# Patient Record
Sex: Female | Born: 1960 | Race: White | Hispanic: No | Smoking: Never smoker
Health system: Southern US, Community
[De-identification: ages and names within clinical notes are randomized; demographics above are authoritative.]

## PROBLEM LIST (undated history)

## (undated) DIAGNOSIS — Z8709 Personal history of other diseases of the respiratory system: Secondary | ICD-10-CM

## (undated) DIAGNOSIS — IMO0002 Reserved for concepts with insufficient information to code with codable children: Secondary | ICD-10-CM

## (undated) DIAGNOSIS — N3090 Cystitis, unspecified without hematuria: Secondary | ICD-10-CM

## (undated) DIAGNOSIS — D219 Benign neoplasm of connective and other soft tissue, unspecified: Secondary | ICD-10-CM

## (undated) DIAGNOSIS — R87619 Unspecified abnormal cytological findings in specimens from cervix uteri: Secondary | ICD-10-CM

## (undated) DIAGNOSIS — B019 Varicella without complication: Secondary | ICD-10-CM

## (undated) DIAGNOSIS — B379 Candidiasis, unspecified: Secondary | ICD-10-CM

## (undated) DIAGNOSIS — E611 Iron deficiency: Secondary | ICD-10-CM

## (undated) HISTORY — DX: Iron deficiency: E61.1

## (undated) HISTORY — DX: Benign neoplasm of connective and other soft tissue, unspecified: D21.9

## (undated) HISTORY — DX: Candidiasis, unspecified: B37.9

## (undated) HISTORY — DX: Unspecified abnormal cytological findings in specimens from cervix uteri: R87.619

## (undated) HISTORY — PX: MYOMECTOMY: SHX85

## (undated) HISTORY — DX: Varicella without complication: B01.9

## (undated) HISTORY — DX: Cystitis, unspecified without hematuria: N30.90

## (undated) HISTORY — PX: WISDOM TOOTH EXTRACTION: SHX21

## (undated) HISTORY — PX: TONSILLECTOMY: SHX5217

## (undated) HISTORY — DX: Personal history of other diseases of the respiratory system: Z87.09

## (undated) HISTORY — DX: Reserved for concepts with insufficient information to code with codable children: IMO0002

---

## 2000-09-20 ENCOUNTER — Encounter (INDEPENDENT_AMBULATORY_CARE_PROVIDER_SITE_OTHER): Payer: Self-pay | Admitting: *Deleted

## 2000-09-20 LAB — CONVERTED CEMR LAB

## 2000-09-25 ENCOUNTER — Encounter: Admission: RE | Admit: 2000-09-25 | Discharge: 2000-09-25 | Payer: Self-pay | Admitting: Family Medicine

## 2001-06-27 ENCOUNTER — Encounter: Admission: RE | Admit: 2001-06-27 | Discharge: 2001-06-27 | Payer: Self-pay | Admitting: Family Medicine

## 2002-05-13 ENCOUNTER — Other Ambulatory Visit: Admission: RE | Admit: 2002-05-13 | Discharge: 2002-05-13 | Payer: Self-pay | Admitting: Obstetrics and Gynecology

## 2003-02-05 ENCOUNTER — Encounter: Admission: RE | Admit: 2003-02-05 | Discharge: 2003-05-06 | Payer: Self-pay | Admitting: Family Medicine

## 2003-06-02 ENCOUNTER — Other Ambulatory Visit: Admission: RE | Admit: 2003-06-02 | Discharge: 2003-06-02 | Payer: Self-pay | Admitting: *Deleted

## 2003-06-04 ENCOUNTER — Encounter: Payer: Self-pay | Admitting: Obstetrics and Gynecology

## 2003-06-04 ENCOUNTER — Ambulatory Visit (HOSPITAL_COMMUNITY): Admission: RE | Admit: 2003-06-04 | Discharge: 2003-06-04 | Payer: Self-pay | Admitting: Obstetrics and Gynecology

## 2004-07-29 ENCOUNTER — Ambulatory Visit (HOSPITAL_COMMUNITY): Admission: RE | Admit: 2004-07-29 | Discharge: 2004-07-29 | Payer: Self-pay | Admitting: Family Medicine

## 2004-07-29 ENCOUNTER — Other Ambulatory Visit: Admission: RE | Admit: 2004-07-29 | Discharge: 2004-07-29 | Payer: Self-pay | Admitting: Obstetrics and Gynecology

## 2004-08-02 ENCOUNTER — Other Ambulatory Visit: Admission: RE | Admit: 2004-08-02 | Discharge: 2004-08-02 | Payer: Self-pay | Admitting: Obstetrics and Gynecology

## 2005-07-31 ENCOUNTER — Ambulatory Visit (HOSPITAL_COMMUNITY): Admission: RE | Admit: 2005-07-31 | Discharge: 2005-07-31 | Payer: Self-pay | Admitting: Obstetrics and Gynecology

## 2005-08-03 ENCOUNTER — Other Ambulatory Visit: Admission: RE | Admit: 2005-08-03 | Discharge: 2005-08-03 | Payer: Self-pay | Admitting: Obstetrics and Gynecology

## 2005-08-17 ENCOUNTER — Encounter: Admission: RE | Admit: 2005-08-17 | Discharge: 2005-08-17 | Payer: Self-pay | Admitting: Obstetrics and Gynecology

## 2006-08-08 ENCOUNTER — Encounter: Admission: RE | Admit: 2006-08-08 | Discharge: 2006-08-08 | Payer: Self-pay | Admitting: Obstetrics and Gynecology

## 2006-10-09 ENCOUNTER — Other Ambulatory Visit: Admission: RE | Admit: 2006-10-09 | Discharge: 2006-10-09 | Payer: Self-pay | Admitting: Obstetrics and Gynecology

## 2006-12-28 ENCOUNTER — Emergency Department (HOSPITAL_COMMUNITY): Admission: EM | Admit: 2006-12-28 | Discharge: 2006-12-28 | Payer: Self-pay | Admitting: Emergency Medicine

## 2007-01-18 ENCOUNTER — Encounter (INDEPENDENT_AMBULATORY_CARE_PROVIDER_SITE_OTHER): Payer: Self-pay | Admitting: *Deleted

## 2007-08-27 ENCOUNTER — Encounter: Admission: RE | Admit: 2007-08-27 | Discharge: 2007-08-27 | Payer: Self-pay | Admitting: Family Medicine

## 2007-09-19 ENCOUNTER — Encounter: Admission: RE | Admit: 2007-09-19 | Discharge: 2007-09-19 | Payer: Self-pay | Admitting: Family Medicine

## 2008-08-27 ENCOUNTER — Encounter: Admission: RE | Admit: 2008-08-27 | Discharge: 2008-08-27 | Payer: Self-pay | Admitting: Obstetrics and Gynecology

## 2008-09-08 ENCOUNTER — Encounter: Admission: RE | Admit: 2008-09-08 | Discharge: 2008-09-08 | Payer: Self-pay | Admitting: Obstetrics and Gynecology

## 2009-11-29 ENCOUNTER — Encounter: Admission: RE | Admit: 2009-11-29 | Discharge: 2009-11-29 | Payer: Self-pay | Admitting: Obstetrics and Gynecology

## 2012-01-30 DIAGNOSIS — Z30430 Encounter for insertion of intrauterine contraceptive device: Secondary | ICD-10-CM

## 2012-01-30 DIAGNOSIS — Z30433 Encounter for removal and reinsertion of intrauterine contraceptive device: Secondary | ICD-10-CM

## 2012-02-28 ENCOUNTER — Ambulatory Visit: Payer: Self-pay | Admitting: Obstetrics and Gynecology

## 2012-11-12 ENCOUNTER — Ambulatory Visit (INDEPENDENT_AMBULATORY_CARE_PROVIDER_SITE_OTHER): Payer: BC Managed Care – PPO | Admitting: Obstetrics and Gynecology

## 2012-11-12 ENCOUNTER — Encounter: Payer: Self-pay | Admitting: Obstetrics and Gynecology

## 2012-11-12 VITALS — BP 94/60 | HR 76 | Resp 16 | Ht 66.0 in | Wt 167.0 lb

## 2012-11-12 DIAGNOSIS — Z124 Encounter for screening for malignant neoplasm of cervix: Secondary | ICD-10-CM

## 2012-11-12 DIAGNOSIS — R6889 Other general symptoms and signs: Secondary | ICD-10-CM

## 2012-11-12 DIAGNOSIS — IMO0002 Reserved for concepts with insufficient information to code with codable children: Secondary | ICD-10-CM | POA: Insufficient documentation

## 2012-11-12 NOTE — Progress Notes (Signed)
Annual Exam:    Subjective:    Alexa Berger is a 51 y.o. female, G2P1, who presents for an annual exam.    Last Pap: 01/26/2011 WNL: Yes Regular Periods:no Contraception: Mirena  Monthly Breast exam:no Tetanus<4yrs:yes Nl.Bladder Function:yes Daily BMs:yes Healthy Diet:yes Calcium:yes "Chews" Mammogram:no Date of Mammogram: 2012 per pt but records show 11/29/2009 Exercise:yes Have often Exercise: 2-3 times a week. Seatbelt: yes Abuse at home: no Stressful work:yes Sigmoid-colonoscopy: N/A Bone Density: No PCP: Dr. Larwance Sachs Change in PMH: No Changes Change in FMH:No Changes   History   Social History  . Marital Status: Married    Spouse Name: N/A    Number of Children: N/A  . Years of Education: N/A   Social History Main Topics  . Smoking status: Never Smoker   . Smokeless tobacco: Never Used  . Alcohol Use: Yes     Comment: socially   . Drug Use: No  . Sexually Active: Not Currently -- Female partner(s)    Birth Control/ Protection: IUD   Other Topics Concern  . None   Social History Narrative  . None    Menstrual cycle:   LMP: No LMP recorded. Patient is not currently having periods (Reason: IUD).           Cycle: No menses with IUD  The following portions of the patient's history were reviewed and updated as appropriate: allergies, current medications, past family history, past medical history, past social history, past surgical history and problem list.  Review of Systems Pertinent items are noted in HPI. Breast:Negative for breast lump,nipple discharge or nipple retraction Gastrointestinal: Negative for abdominal pain, change in bowel habits or rectal bleeding Urinary:negative   Objective:    BP 94/60  Ht 5\' 6"  (1.676 m)  Wt 167 lb (75.751 kg)  BMI 26.95 kg/m2    Weight:  Wt Readings from Last 1 Encounters:  11/12/12 167 lb (75.751 kg)          BMI: Body mass index is 26.95 kg/(m^2).  General Appearance: Alert, appropriate appearance  for age. No acute distress HEENT: Grossly normal Neck / Thyroid: Supple, no masses, nodes or enlargement Lungs: clear to auscultation bilaterally Back: No CVA tenderness Breast Exam: No masses or nodes.No dimpling, nipple retraction or discharge. Cardiovascular: Regular rate and rhythm. S1, S2, no murmur Gastrointestinal: Soft, non-tender, no masses or organomegaly Pelvic Exam: External genitalia: normal general appearance Vaginal: normal mucosa without prolapse or lesions Cervix: IUD string visualized Adnexa: normal bimanual exam Uterus: normal single, nontender Rectovaginal: normal rectal, no masses Lymphatic Exam: Non-palpable nodes in neck, clavicular, axillary, or inguinal regions Skin: no rash or abnormalities Neurologic: Normal gait and speech, no tremor  Psychiatric: Alert and oriented, appropriate affect.   Wet Prep:not applicable Urinalysis:not applicable UPT: Not done   Assessment:    Normal gyn exam    Plan:    mammogram pap smear return annually or prn STD screening: declined Contraception:IUD   Dierdre Forth MD

## 2012-11-15 LAB — PAP IG AND HPV HIGH-RISK

## 2012-11-21 ENCOUNTER — Encounter: Payer: Self-pay | Admitting: Obstetrics and Gynecology

## 2012-12-24 ENCOUNTER — Other Ambulatory Visit: Payer: Self-pay | Admitting: Obstetrics and Gynecology

## 2012-12-24 DIAGNOSIS — Z1231 Encounter for screening mammogram for malignant neoplasm of breast: Secondary | ICD-10-CM

## 2013-01-28 ENCOUNTER — Ambulatory Visit
Admission: RE | Admit: 2013-01-28 | Discharge: 2013-01-28 | Disposition: A | Discharge status: Home / Self Care | Payer: BC Managed Care – PPO | Source: Ambulatory Visit | Attending: Obstetrics and Gynecology | Admitting: Obstetrics and Gynecology

## 2013-09-18 ENCOUNTER — Other Ambulatory Visit (HOSPITAL_COMMUNITY): Payer: Self-pay | Admitting: Endocrinology

## 2013-09-18 DIAGNOSIS — E0590 Thyrotoxicosis, unspecified without thyrotoxic crisis or storm: Secondary | ICD-10-CM

## 2013-10-02 ENCOUNTER — Ambulatory Visit (HOSPITAL_COMMUNITY)
Admission: RE | Admit: 2013-10-02 | Discharge: 2013-10-02 | Disposition: A | Discharge status: Home / Self Care | Payer: BC Managed Care – PPO | Source: Ambulatory Visit | Attending: Endocrinology | Admitting: Endocrinology

## 2013-10-02 DIAGNOSIS — E0590 Thyrotoxicosis, unspecified without thyrotoxic crisis or storm: Secondary | ICD-10-CM | POA: Insufficient documentation

## 2013-10-03 ENCOUNTER — Other Ambulatory Visit (HOSPITAL_COMMUNITY): Payer: Self-pay | Admitting: Endocrinology

## 2013-10-03 ENCOUNTER — Encounter (HOSPITAL_COMMUNITY): Payer: Self-pay

## 2013-10-03 ENCOUNTER — Encounter (HOSPITAL_COMMUNITY)
Admission: RE | Admit: 2013-10-03 | Discharge: 2013-10-03 | Disposition: A | Discharge status: Home / Self Care | Payer: BC Managed Care – PPO | Source: Ambulatory Visit | Attending: Endocrinology | Admitting: Endocrinology

## 2013-10-03 DIAGNOSIS — E0590 Thyrotoxicosis, unspecified without thyrotoxic crisis or storm: Secondary | ICD-10-CM

## 2013-10-03 MED ORDER — SODIUM IODIDE I 131 CAPSULE
12.0000 | Freq: Once | INTRAVENOUS | Status: AC | PRN
  Administered 2013-10-02: 12 via ORAL

## 2013-10-03 MED ORDER — SODIUM PERTECHNETATE TC 99M INJECTION
10.0000 | Freq: Once | INTRAVENOUS | Status: AC | PRN
  Administered 2013-10-03: 10 via INTRAVENOUS

## 2014-04-28 ENCOUNTER — Ambulatory Visit (INDEPENDENT_AMBULATORY_CARE_PROVIDER_SITE_OTHER): Payer: BC Managed Care – PPO | Admitting: Sports Medicine

## 2014-04-28 ENCOUNTER — Encounter: Payer: Self-pay | Admitting: Sports Medicine

## 2014-04-28 VITALS — BP 93/65 | Ht 66.0 in | Wt 160.0 lb

## 2014-04-28 DIAGNOSIS — M722 Plantar fascial fibromatosis: Secondary | ICD-10-CM | POA: Insufficient documentation

## 2014-04-28 NOTE — Progress Notes (Signed)
Patient ID: Alexa Berger, female   DOB: July 06, 1961, 53 y.o.   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 0.46 on RT There is some globular thickening at insertion

## 2014-04-28 NOTE — Patient Instructions (Signed)
Use standard set of exercises Goal is 3 sets of 15 Twice daily if time  When standing or walking use sports insole and arch strap  Stretch twice daily do 3 stretches of 30 secs with thumb massage  Icing at the end of the day really helps but you have to make it painful - eg foot in ice water - 5 to 10 mins  Do these things for 2 months and let's recheck  Thickness today was 0.46 vs. 0.66

## 2014-04-28 NOTE — Assessment & Plan Note (Signed)
Given sports insoles with scaphoid pad on left Arch strap HEP Stretching program Icing Keep up activity but cut down by 30%  Reck 2 mos

## 2014-07-02 ENCOUNTER — Ambulatory Visit: Payer: BC Managed Care – PPO | Admitting: Sports Medicine

## 2014-08-18 ENCOUNTER — Encounter: Payer: Self-pay | Admitting: Sports Medicine

## 2014-08-18 ENCOUNTER — Ambulatory Visit (INDEPENDENT_AMBULATORY_CARE_PROVIDER_SITE_OTHER): Payer: BC Managed Care – PPO | Admitting: Sports Medicine

## 2014-08-18 VITALS — BP 106/73 | HR 83 | Ht 66.0 in | Wt 160.0 lb

## 2014-08-18 DIAGNOSIS — M79609 Pain in unspecified limb: Secondary | ICD-10-CM | POA: Diagnosis not present

## 2014-08-18 DIAGNOSIS — M79671 Pain in right foot: Secondary | ICD-10-CM

## 2014-08-18 DIAGNOSIS — M722 Plantar fascial fibromatosis: Secondary | ICD-10-CM

## 2014-08-18 DIAGNOSIS — M79674 Pain in right toe(s): Secondary | ICD-10-CM | POA: Insufficient documentation

## 2014-08-18 NOTE — Progress Notes (Signed)
   Subjective:    Patient ID: Alexa Berger, female    DOB: [birth year], 53 y.o.   MRN: [id number]  HPI Ms. Saxton is a 53 year old female presents for followup of bilateral plantar fasciitis foot pain. She states her pain is 50% improved. She has been doing home exercises and yoga. She still has some left heel pain on the patient plantar surface with the first few steps in the morning. She is now able to walk for approximately one-hour on a treadmill without pain. The exercises and stretching have relieved her symptoms. Prolonged activity aggravates him. Shows some notes some right great toe pain that is worse with walking. She denies any associated swelling, numbness, tingling, or weakness.  Past medical history, social history, medications, and allergies were reviewed and are up to date in the chart.  Review of Systems 7 point review of systems was performed and was otherwise negative unless noted in the history of present illness.    Objective:   Physical Exam BP 106/73  Pulse 83  Ht 5\' 6"  (1.676 m)  Wt 160 lb (72.576 kg)  BMI 25.84 kg/m2 GEN: The patient is well-developed well-nourished female and in no acute distress.  She is awake alert and oriented x3. SKIN: warm and well-perfused, no rash  EXTR: No lower extremity edema or calf tenderness Neuro: Strength 5/5 globally. Sensation intact throughout. DTRs 2/4 bilaterally. No focal deficits. Vasc: +2 bilateral distal pulses. No edema.  MSK: Examination of the bilateral feet reveals a somewhat flattened medial longitudinal arch. She has a Morton's foot bilaterally. There is very mild tenderness to palpation with passive stretching of the plantar fascia at the left heel. She has full range of motion at the ankle and forefoot. No overlying erythema, swelling, or induration.  Limited musculoskeletal ultrasound: Long and short axis views of the bilateral plantar fascia were obtained. The left plantar fascia measures 0.64 cm with less  echogenicity seen at the plantar fascia 1 cm distal to the calcaneus. Examination of the right first MTP joint reveals increased echogenicity and small calcific spur at the dorsal aspect of the medial first MTP joint on the right. This is correlated with the location of her pain.     Assessment & Plan:  Please see problem based assessment and plan.

## 2014-08-18 NOTE — Assessment & Plan Note (Signed)
-  We added bilateral 1st ray posts, as this is likely related to additional pressure from her Morton's foot. -A scaphoid pad was added to the right insole as well -An additional pair of sport insoles was provided today with small scaphoid pads bilaterally and 1st ray padding bilaterally. -Continue home stretches and exercises. Follow-up as needed.

## 2014-09-21 ENCOUNTER — Encounter: Payer: Self-pay | Admitting: Sports Medicine

## 2017-10-15 ENCOUNTER — Ambulatory Visit
Admission: RE | Admit: 2017-10-15 | Discharge: 2017-10-15 | Disposition: A | Discharge status: Home / Self Care | Payer: BC Managed Care – PPO | Source: Ambulatory Visit | Attending: Family Medicine | Admitting: Family Medicine

## 2017-10-15 ENCOUNTER — Other Ambulatory Visit: Payer: Self-pay | Admitting: Family Medicine

## 2017-10-15 DIAGNOSIS — G43909 Migraine, unspecified, not intractable, without status migrainosus: Secondary | ICD-10-CM

## 2018-03-19 ENCOUNTER — Other Ambulatory Visit: Payer: Self-pay | Admitting: Family Medicine

## 2018-03-19 ENCOUNTER — Ambulatory Visit
Admission: RE | Admit: 2018-03-19 | Discharge: 2018-03-19 | Disposition: A | Discharge status: Home / Self Care | Payer: BC Managed Care – PPO | Source: Ambulatory Visit | Attending: Family Medicine | Admitting: Family Medicine

## 2018-03-19 DIAGNOSIS — M545 Low back pain, unspecified: Secondary | ICD-10-CM

## 2019-06-09 ENCOUNTER — Other Ambulatory Visit: Payer: Self-pay

## 2019-06-09 DIAGNOSIS — Z20828 Contact with and (suspected) exposure to other viral communicable diseases: Secondary | ICD-10-CM

## 2019-06-09 DIAGNOSIS — Z20822 Contact with and (suspected) exposure to covid-19: Secondary | ICD-10-CM

## 2019-06-12 LAB — NOVEL CORONAVIRUS, NAA: SARS-CoV-2, NAA: NOT DETECTED

## 2019-06-30 ENCOUNTER — Other Ambulatory Visit: Payer: Self-pay

## 2019-06-30 DIAGNOSIS — Z20822 Contact with and (suspected) exposure to covid-19: Secondary | ICD-10-CM

## 2019-07-01 LAB — NOVEL CORONAVIRUS, NAA: SARS-CoV-2, NAA: NOT DETECTED

## 2019-07-09 ENCOUNTER — Other Ambulatory Visit: Payer: Self-pay

## 2019-07-09 DIAGNOSIS — Z20822 Contact with and (suspected) exposure to covid-19: Secondary | ICD-10-CM

## 2019-07-10 LAB — NOVEL CORONAVIRUS, NAA: SARS-CoV-2, NAA: NOT DETECTED

## 2019-08-07 ENCOUNTER — Other Ambulatory Visit: Payer: Self-pay

## 2019-08-07 DIAGNOSIS — Z20822 Contact with and (suspected) exposure to covid-19: Secondary | ICD-10-CM

## 2019-08-09 LAB — NOVEL CORONAVIRUS, NAA: SARS-CoV-2, NAA: NOT DETECTED

## 2019-08-26 ENCOUNTER — Other Ambulatory Visit: Payer: Self-pay

## 2019-08-26 DIAGNOSIS — Z20822 Contact with and (suspected) exposure to covid-19: Secondary | ICD-10-CM

## 2019-08-28 LAB — NOVEL CORONAVIRUS, NAA: SARS-CoV-2, NAA: NOT DETECTED

## 2019-09-17 ENCOUNTER — Other Ambulatory Visit: Payer: Self-pay

## 2019-09-17 DIAGNOSIS — Z20822 Contact with and (suspected) exposure to covid-19: Secondary | ICD-10-CM

## 2019-09-18 LAB — NOVEL CORONAVIRUS, NAA: SARS-CoV-2, NAA: NOT DETECTED

## 2019-10-14 ENCOUNTER — Other Ambulatory Visit: Payer: Self-pay

## 2019-10-14 DIAGNOSIS — Z20822 Contact with and (suspected) exposure to covid-19: Secondary | ICD-10-CM

## 2019-10-15 LAB — NOVEL CORONAVIRUS, NAA: SARS-CoV-2, NAA: NOT DETECTED

## 2019-11-13 ENCOUNTER — Ambulatory Visit: Payer: BC Managed Care – PPO | Attending: Internal Medicine

## 2019-11-13 DIAGNOSIS — Z20822 Contact with and (suspected) exposure to covid-19: Secondary | ICD-10-CM

## 2019-11-15 LAB — NOVEL CORONAVIRUS, NAA: SARS-CoV-2, NAA: NOT DETECTED

## 2021-02-04 ENCOUNTER — Other Ambulatory Visit: Payer: Self-pay | Admitting: Family Medicine

## 2021-02-04 DIAGNOSIS — E063 Autoimmune thyroiditis: Secondary | ICD-10-CM

## 2021-02-18 ENCOUNTER — Ambulatory Visit
Admission: RE | Admit: 2021-02-18 | Discharge: 2021-02-18 | Disposition: A | Discharge status: Home / Self Care | Payer: BC Managed Care – PPO | Source: Ambulatory Visit | Attending: Family Medicine | Admitting: Family Medicine

## 2021-02-18 DIAGNOSIS — E063 Autoimmune thyroiditis: Secondary | ICD-10-CM

## 2021-08-13 IMAGING — US US THYROID
1 series · 12 of 25 positions shown · non-contrast
Comparison: Prior thyroid scan and uptake on 10/02/2013

CLINICAL DATA: Palpable abnormality. History of hyperthyroidism,
Hashimoto's thyroiditis and palpable left thyroid nodule. Previous
nuclear medicine thyroid scan demonstrated nodular uptake in the mid
to lower left lobe consistent with a functioning nodule.

EXAM:
THYROID ULTRASOUND
TECHNIQUE: Ultrasound examination of the thyroid gland and adjacent soft
tissues was performed.

[Series 1: us thyroid · 0.03mm/px · 12 of 64 slices shown]
[im 3/64]
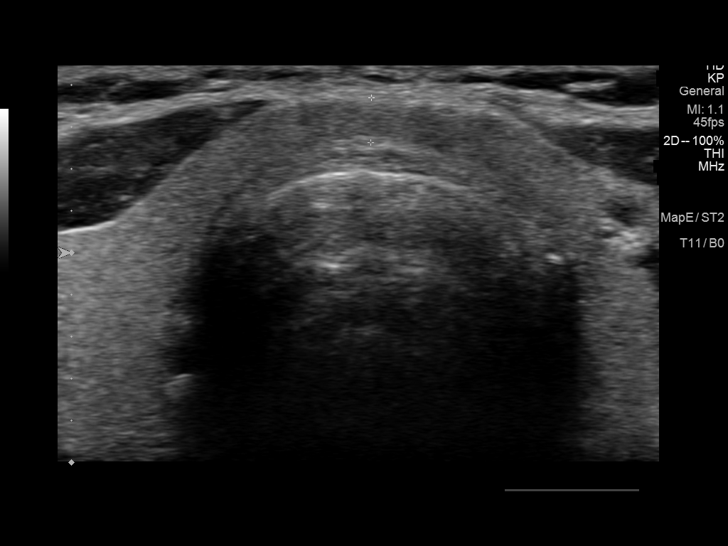
[im 8/64]
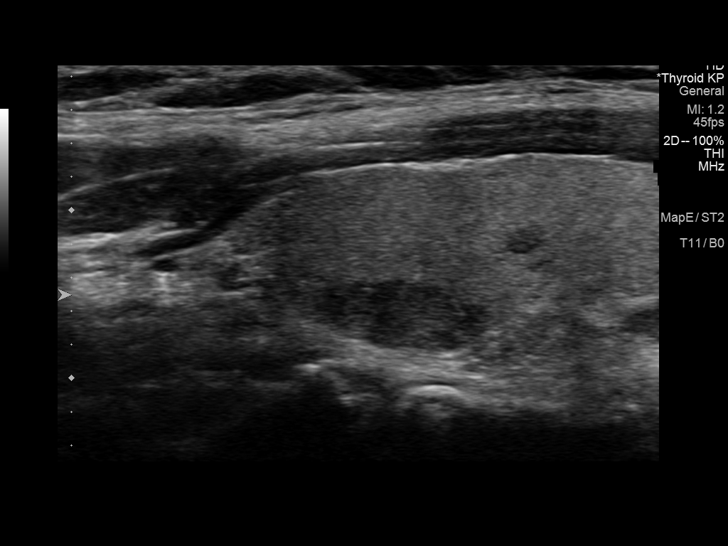
[im 14/64]
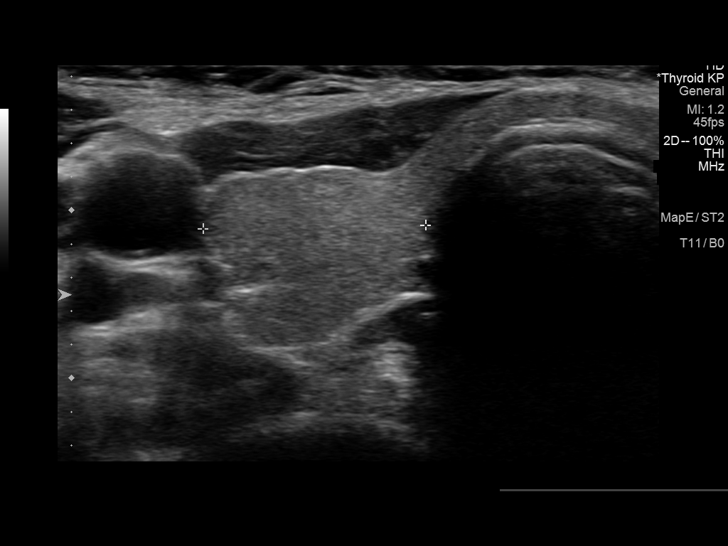
[im 19/64]
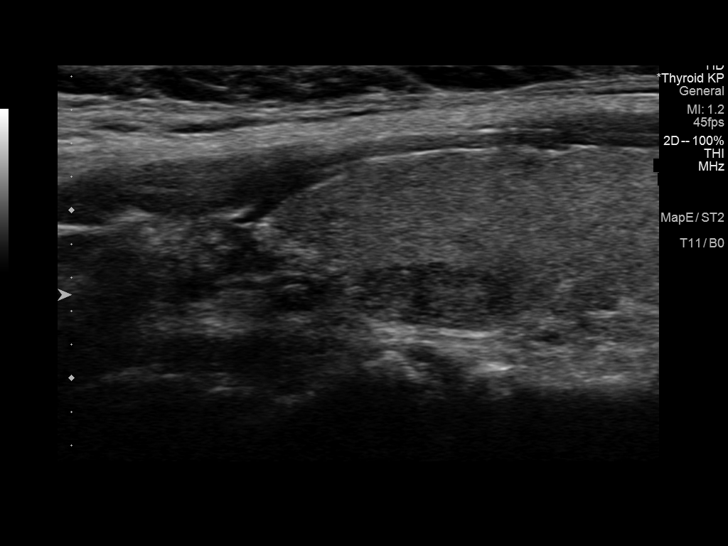
[im 24/64]
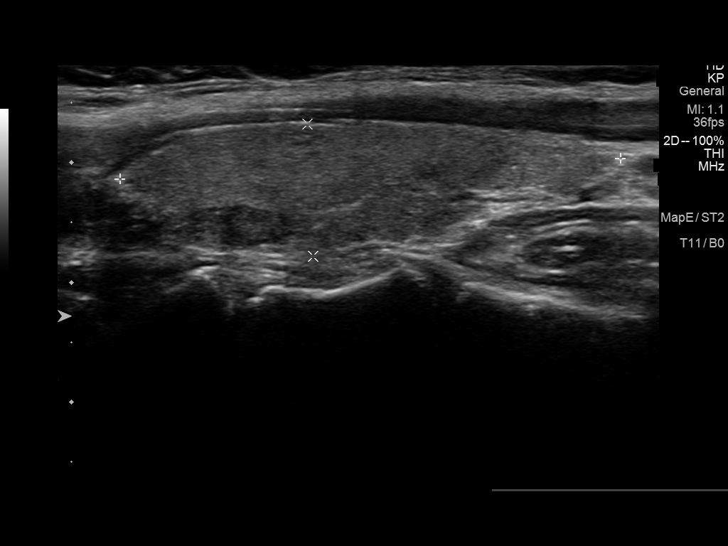
[im 29/64]
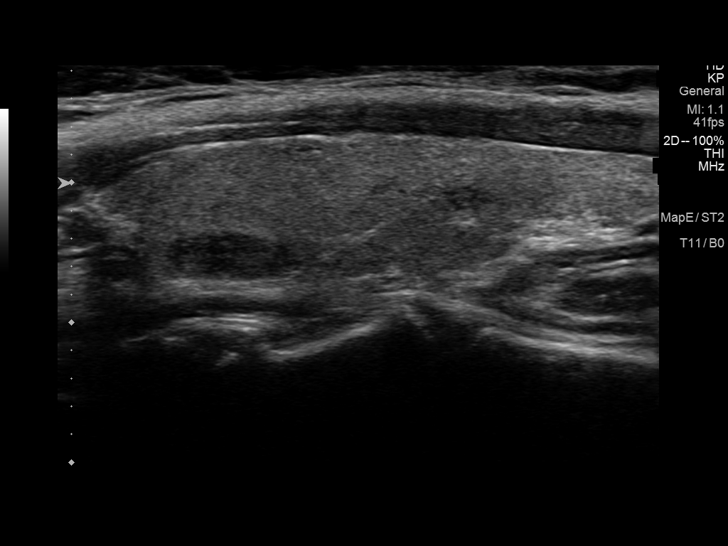
[im 35/64]
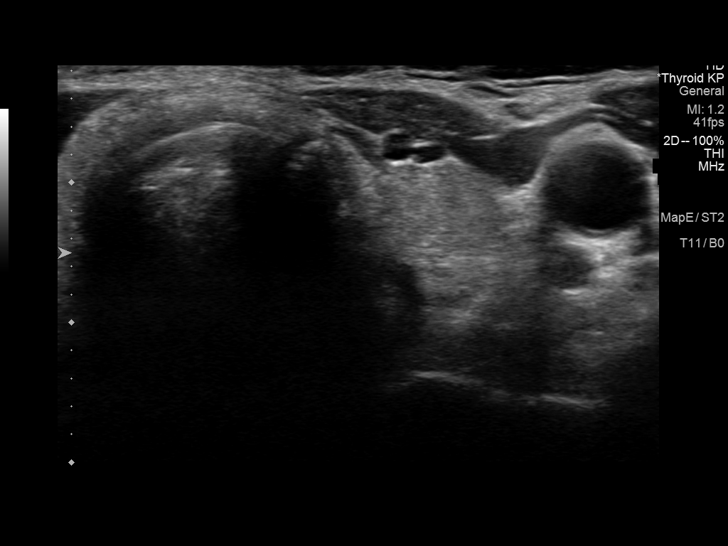
[im 40/64]
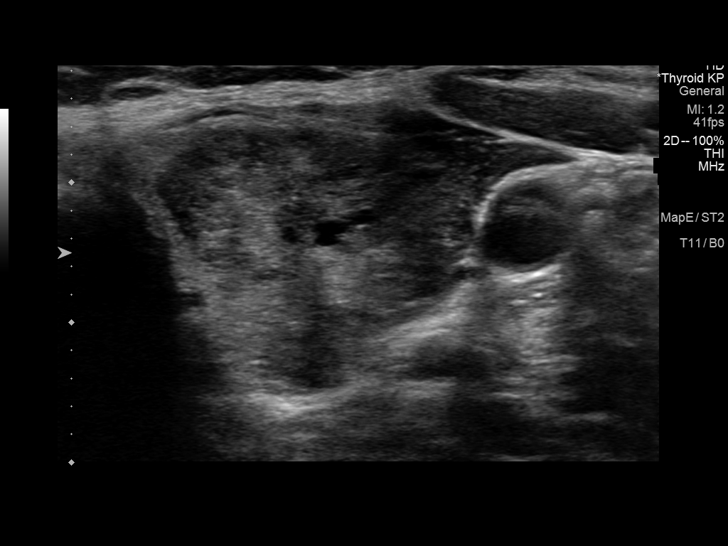
[im 45/64]
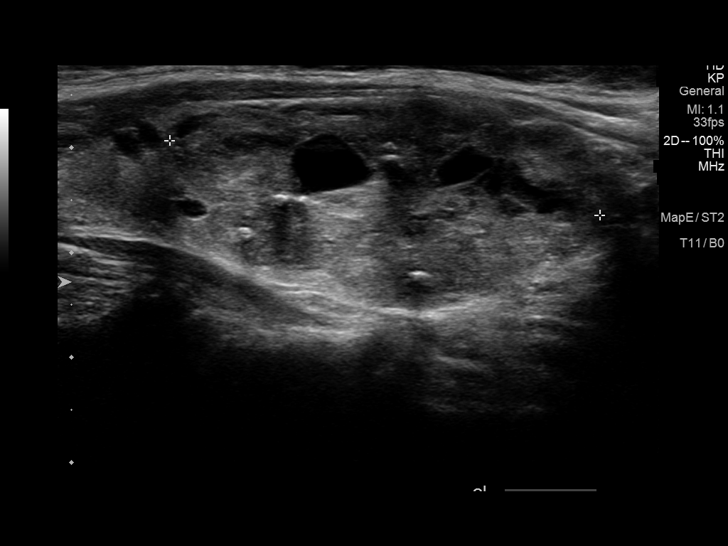
[im 50/64]
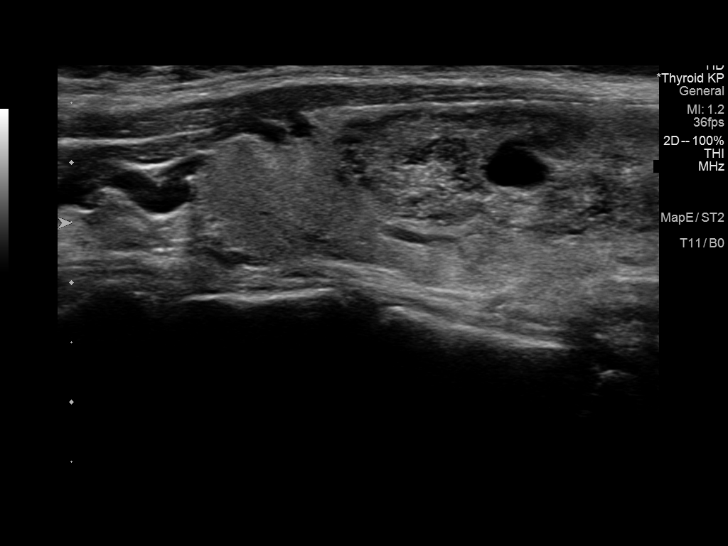
[im 56/64]
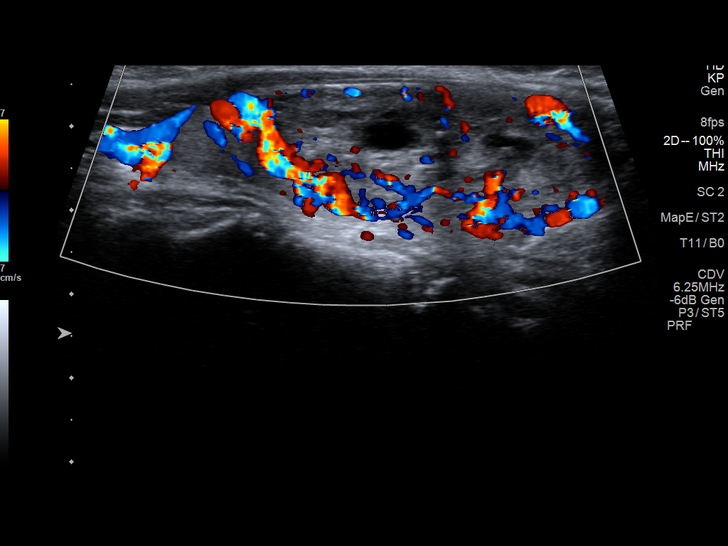
[im 61/64]
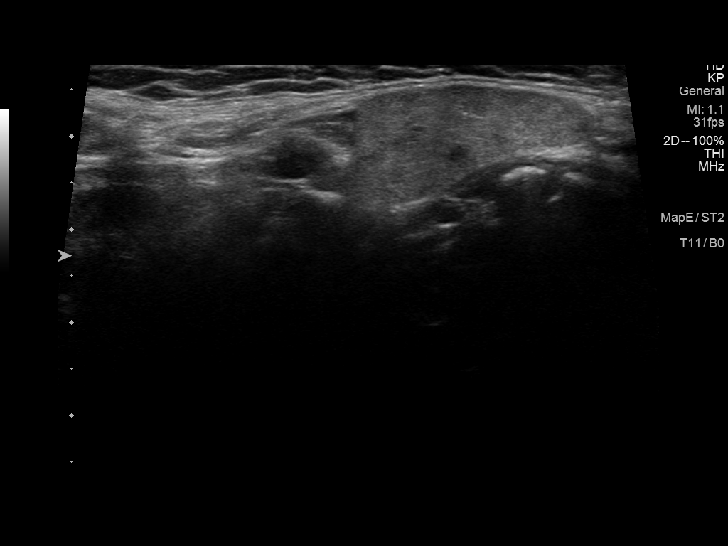

[12 of 25 positions shown; findings below may reference images not displayed]

FINDINGS: Parenchymal Echotexture: Normal

Isthmus: 0.2 cm

Right lobe: 4.2 x 1.1 x 1.3 cm

Left lobe: 5.6 x 2.1 x 1.3 cm

_________________________________________________________

Estimated total number of nodules >/= 1 cm: 2

Number of spongiform nodules >/=  2 cm not described below (TR1): 0

Number of mixed cystic and solid nodules >/= 1.5 cm not described
below (TR2): 0

_________________________________________________________

Nodule # 1:

Location: Right; Superior

Maximum size: 1.0 cm; Other 2 dimensions: 0.4 x 0.6 cm

Composition: solid/almost completely solid (2)

Echogenicity: hypoechoic (2)

Shape: not taller-than-wide (0)

Margins: smooth (0)

Echogenic foci: none (0)

ACR TI-RADS total points: 4.

ACR TI-RADS risk category: TR4 (4-6 points).

ACR TI-RADS recommendations:

*Given size (>/= 1 - 1.4 cm) and appearance, a follow-up ultrasound
in 1 year should be considered based on TI-RADS criteria.

_________________________________________________________

Nodule # 2:

Location: Left; Inferior

Maximum size: 4.2 cm; Other 2 dimensions: 2.2 x 1.8 cm

Composition: solid/almost completely solid (2). Some scattered
internal cystic areas.

Echogenicity: isoechoic (1)

Shape: not taller-than-wide (0)

Margins: ill-defined (0)

Echogenic foci: none (0)

ACR TI-RADS total points: 3.

ACR TI-RADS risk category: TR3 (3 points).

ACR TI-RADS recommendations:

**Given size (>/= 2.5 cm) and appearance, fine needle aspiration of
this mildly suspicious nodule should be considered based on TI-RADS
criteria. However, this corresponds well in size and location to the
previous nuclear medicine hyperfunctioning nodule and the fact that
this area was previously hyperfunctioning reduces the chance of
malignancy. Follow-up ultrasound may be reasonable given that there
is no prior ultrasound imaging of the thyroid for comparison.

_________________________________________________________

No abnormal lymph nodes identified.
IMPRESSION: 1. 4.2 cm left inferior thyroid nodule technically meets criteria
for fine-needle aspiration based on current ultrasound appearance.
However, this corresponds in size and location to the previous
hyperfunctioning nodule seen by nuclear medicine back in 0734. This
reduces the risk of malignancy. Follow-up ultrasound would also be
reasonable. Referral to Endocrinology for management might be
helpful.
2. 1 cm right superior thyroid nodule meets criteria for 1 year
follow-up ultrasound.

The above is in keeping with the ACR TI-RADS recommendations - [HOSPITAL] 7424;[DATE].

## 2021-09-15 LAB — COLOGUARD

## 2022-08-25 LAB — COLOGUARD: COLOGUARD: NEGATIVE

## 2022-08-25 LAB — EXTERNAL GENERIC LAB PROCEDURE: COLOGUARD: NEGATIVE

## 2022-08-30 ENCOUNTER — Encounter: Payer: Self-pay | Admitting: "Endocrinology

## 2022-08-30 ENCOUNTER — Ambulatory Visit: Payer: BC Managed Care – PPO | Admitting: "Endocrinology

## 2022-08-30 VITALS — BP 92/68 | HR 84 | Ht 64.75 in | Wt 168.2 lb

## 2022-08-30 DIAGNOSIS — E042 Nontoxic multinodular goiter: Secondary | ICD-10-CM

## 2022-08-30 NOTE — Progress Notes (Signed)
Endocrinology Consult Note                                            08/30/2022, 5:07 PM   Subjective:    Patient ID: Alexa Berger, female    DOB: 03-Aug-1961, PCP Derinda Late, MD   Past Medical History:  Diagnosis Date   Abnormal pap    ASCUS Neg HPV   Bladder infection    Chicken pox    Fibroids    H/O allergic rhinitis    Low iron    Yeast infection    Past Surgical History:  Procedure Laterality Date   MYOMECTOMY     TONSILLECTOMY     WISDOM TOOTH EXTRACTION     Social History   Socioeconomic History   Marital status: Divorced    Spouse name: Not on file   Number of children: Not on file   Years of education: Not on file   Highest education level: Not on file  Occupational History   Not on file  Tobacco Use   Smoking status: Never   Smokeless tobacco: Never  Substance and Sexual Activity   Alcohol use: Yes    Comment: socially    Drug use: No   Sexual activity: Not Currently    Partners: Male    Birth control/protection: I.U.D.  Other Topics Concern   Not on file  Social History Narrative   Not on file   Social Determinants of Health   Financial Resource Strain: Not on file  Food Insecurity: Not on file  Transportation Needs: Not on file  Physical Activity: Not on file  Stress: Not on file  Social Connections: Not on file   Family History  Problem Relation Age of Onset   Osteoporosis Mother    Thyroid disease Mother    Osteoporosis Maternal Grandmother    Thyroid disease Other    Hypertension Other    Migraines Other    Outpatient Encounter Medications as of 08/30/2022  Medication Sig   acetaminophen (TYLENOL) 500 MG tablet Take 500 mg by mouth every 6 (six) hours as needed.   B Complex Vitamins (VITAMIN B COMPLEX PO) Take 1 tablet by mouth daily.   CALCIUM-MAGNESIUM-ZINC PO Take 1 tablet by mouth daily.   Cholecalciferol (VITAMIN D3 PO) Take 1 tablet by mouth daily.   Multiple Vitamin (MULTIVITAMIN ADULT PO) Take 1  tablet by mouth daily.   OVER THE COUNTER MEDICATION 1 tablet daily. MSM Joint Formula with Glucosamine   cetirizine (ZYRTEC) 10 MG tablet Take 10 mg by mouth.   ibuprofen (ADVIL,MOTRIN) 200 MG tablet Take 200 mg by mouth every 6 (six) hours as needed.   naproxen sodium (ANAPROX) 220 MG tablet Take 220 mg by mouth 2 (two) times daily with a meal.   triamcinolone cream (KENALOG) 0.1 %    [DISCONTINUED] cephALEXin (KEFLEX) 500 MG capsule    [DISCONTINUED] levonorgestrel (MIRENA) 20 MCG/24HR IUD 1 each by Intrauterine route once.   No facility-administered encounter medications on file as of 08/30/2022.   ALLERGIES: Allergies  Allergen Reactions   Penicillins    Promethazine Hcl     Patient states she had hallucinations using phenergan    VACCINATION STATUS: Immunization History  Administered Date(s) Administered   Td 11/20/1985    HPI Alexa Berger is 61 y.o. female who presents today with a  medical history as above. she is being seen in consultation for multinodular goiter requested by Derinda Late, MD.  History is obtained directly from the patient.  She was found to have multiple nodules on prior ultrasound performed in April 2022.   Patient was also noted to have suppressed TSH previously.  Thyroid uptake and scan thousand 14 was unremarkable.  She was not offered antithyroid intervention at that time.  She does not have recent thyroid function tests. She denies dysphagia, shortness of breath, nor voice change.  She does have family history of thyroid dysfunction in her mother and grandparent. She is not currently on antithyroid intervention or thyroid hormone supplements. However, she is on several supplements including multivitamin, vitamin D3, B complex, calcium/magnesium/zinc, and the same joint formula with glucosamine and chondroitin sulfate.  She does not have other major medical problems.  Review of Systems  Constitutional: + Minimally fluctuating body weight,  no  fatigue, no subjective hyperthermia, no subjective hypothermia Eyes: no blurry vision, no xerophthalmia ENT: no sore throat, no nodules palpated in throat, no dysphagia/odynophagia, no hoarseness Cardiovascular: no Chest Pain, no Shortness of Breath, no palpitations, no leg swelling Respiratory: no cough, no shortness of breath Gastrointestinal: no Nausea/Vomiting/Diarhhea Musculoskeletal: no muscle/joint aches Skin: no rashes Neurological: no tremors, no numbness, no tingling, no dizziness Psychiatric: no depression, no anxiety  Objective:       08/30/2022    1:16 PM 08/18/2014    3:57 PM 04/28/2014    3:25 PM  Vitals with BMI  Height 5' 4.75" '5\' 6"'$    Weight 168 lbs 3 oz 160 lbs   BMI 65.78 46.9   Systolic 92 629 93  Diastolic 68 73 65  Pulse 84 83     BP 92/68   Pulse 84   Ht 5' 4.75" (1.645 m)   Wt 168 lb 3.2 oz (76.3 kg)   BMI 28.21 kg/m   Wt Readings from Last 3 Encounters:  08/30/22 168 lb 3.2 oz (76.3 kg)  08/18/14 160 lb (72.6 kg)  04/28/14 160 lb (72.6 kg)    Physical Exam  Constitutional:  Body mass index is 28.21 kg/m.,  not in acute distress, normal state of mind Eyes: PERRLA, EOMI, no exophthalmos ENT: moist mucous membranes, no gross thyromegaly, + gross cervical lymphadenopathy Cardiovascular: normal precordial activity, Regular Rate and Rhythm, no Murmur/Rubs/Gallops Respiratory:  adequate breathing efforts, no gross chest deformity, Clear to auscultation bilaterally Gastrointestinal: abdomen soft, Non -tender, No distension, Bowel Sounds present, no gross organomegaly Musculoskeletal: no gross deformities, strength intact in all four extremities Skin: moist, warm, no rashes Neurological: no tremor with outstretched hands, Deep tendon reflexes normal in bilateral lower extremities.  Endocrine/2022 thyroid ultrasound: Right lobe 4.2 cm with 1 cm nodule, left lobe 5.6 cm with 4.2 cm large dominant nodule.   Assessment & Plan:   1. Multinodular  goiter  - Alexa Berger  is being seen at a kind request of Derinda Late, MD. - I have reviewed her available thyroid records and clinically evaluated the patient. - Based on these reviews, she has multinodular goiter,  however,  there is not sufficient information to proceed with definitive treatment plan. Her previous work-ups are 2 remote to go by for decision.   She will start with new set of thyroid function tests including:  - TSH - T4, free - T3, free - Thyroid peroxidase antibody - Thyroglobulin antibody  -If her labs are indicative of hyperthyroidism, she will be considered for thyroid uptake and  scan.  If her labs are consistent with euthyroid presentation, she will be considered for fine-needle aspiration biopsy of the large 4.2 cm nodule in the left lobe of her thyroid.  Reportedly, she did have a bone density which showed osteopenia.  We will attempt to obtain a copy of her reports.  - I did not initiate any new prescriptions today. - she is advised to maintain close follow up with Derinda Late, MD for primary care needs.   - Time spent with the patient: 50 minutes, of which >50% was spent in  counseling her about her multinodular goiter and the rest in obtaining information about her symptoms, reviewing her previous labs/studies ( including abstractions from other facilities),  evaluations, and treatments,  and developing a plan to confirm diagnosis and long term treatment based on the latest standards of care/guidelines; and documenting her care.  Alexa Berger participated in the discussions, expressed understanding, and voiced agreement with the above plans.  All questions were answered to her satisfaction. she is encouraged to contact clinic should she have any questions or concerns prior to her return visit.  Follow up plan: Return in about 1 week (around 09/06/2022) for F/U with Pre-visit Labs.   Glade Lloyd, MD [facility] [address] [place], [place] [address] Phone: [phone]  Fax: [phone]     08/30/2022, 5:07 PM  This note was partially dictated with voice recognition software. Similar sounding words can be transcribed inadequately or may not  be corrected upon review.

## 2022-08-31 LAB — THYROID PEROXIDASE ANTIBODY: Thyroperoxidase Ab SerPl-aCnc: 12 IU/mL (ref 0–34)

## 2022-08-31 LAB — TSH: TSH: 0.082 u[IU]/mL — ABNORMAL LOW (ref 0.450–4.500)

## 2022-08-31 LAB — T4, FREE: Free T4: 1.08 ng/dL (ref 0.82–1.77)

## 2022-08-31 LAB — THYROGLOBULIN ANTIBODY: Thyroglobulin Antibody: <1 IU/mL (ref 0.0–0.9)

## 2022-08-31 LAB — T3, FREE: T3, Free: 3 pg/mL (ref 2.0–4.4)

## 2022-09-01 ENCOUNTER — Telehealth: Payer: Self-pay

## 2022-09-01 ENCOUNTER — Telehealth: Payer: Self-pay | Admitting: "Endocrinology

## 2022-09-01 NOTE — Telephone Encounter (Signed)
Noted  

## 2022-09-01 NOTE — Telephone Encounter (Signed)
-----   Message from Cassandria Anger, MD sent at 08/31/2022  4:34 PM EDT ----- This patient told us that she did have bone density which showed osteopenia.  We need a copy of her bone density to discuss during her next visit.  PCP may have a copy.

## 2022-09-01 NOTE — Telephone Encounter (Signed)
I called patient and she said for [place] to call her PCP, Dr Horald Pollen at [facility]. I did call but they do not open until 9 am.

## 2022-09-01 NOTE — Telephone Encounter (Signed)
Spoke with PCP office, they will fax results. Thanks

## 2022-09-01 NOTE — Telephone Encounter (Signed)
[place] spoke with pt's PCP office and they stated they would fax results.

## 2022-09-06 ENCOUNTER — Encounter: Payer: Self-pay | Admitting: "Endocrinology

## 2022-09-06 ENCOUNTER — Ambulatory Visit: Payer: BC Managed Care – PPO | Admitting: "Endocrinology

## 2022-09-06 VITALS — BP 108/78 | HR 92 | Ht 64.75 in | Wt 173.4 lb

## 2022-09-06 DIAGNOSIS — E0520 Thyrotoxicosis with toxic multinodular goiter without thyrotoxic crisis or storm: Secondary | ICD-10-CM | POA: Diagnosis not present

## 2022-09-06 DIAGNOSIS — M816 Localized osteoporosis [Lequesne]: Secondary | ICD-10-CM | POA: Diagnosis not present

## 2022-09-06 NOTE — Progress Notes (Signed)
09/06/2022, 6:58 PM   Endocrinology follow-up note  Subjective:    Patient ID: Alexa Berger, female    DOB: 1961/05/03, PCP Derinda Late, MD   Past Medical History:  Diagnosis Date   Abnormal pap    ASCUS Neg HPV   Bladder infection    Chicken pox    Fibroids    H/O allergic rhinitis    Low iron    Yeast infection    Past Surgical History:  Procedure Laterality Date   MYOMECTOMY     TONSILLECTOMY     WISDOM TOOTH EXTRACTION     Social History   Socioeconomic History   Marital status: Divorced    Spouse name: Not on file   Number of children: Not on file   Years of education: Not on file   Highest education level: Not on file  Occupational History   Not on file  Tobacco Use   Smoking status: Never   Smokeless tobacco: Never  Substance and Sexual Activity   Alcohol use: Yes    Comment: socially    Drug use: No   Sexual activity: Not Currently    Partners: Male    Birth control/protection: I.U.D.  Other Topics Concern   Not on file  Social History Narrative   Not on file   Social Determinants of Health   Financial Resource Strain: Not on file  Food Insecurity: Not on file  Transportation Needs: Not on file  Physical Activity: Not on file  Stress: Not on file  Social Connections: Not on file   Family History  Problem Relation Age of Onset   Osteoporosis Mother    Thyroid disease Mother    Osteoporosis Maternal Grandmother    Thyroid disease Other    Hypertension Other    Migraines Other    Outpatient Encounter Medications as of 09/06/2022  Medication Sig   acetaminophen (TYLENOL) 500 MG tablet Take 500 mg by mouth every 6 (six) hours as needed.   B Complex Vitamins (VITAMIN B COMPLEX PO) Take 1 tablet by mouth daily.   CALCIUM-MAGNESIUM-ZINC PO Take 1 tablet by mouth daily.   cetirizine (ZYRTEC) 10 MG tablet Take 10 mg by mouth.   Cholecalciferol (VITAMIN D3 PO) Take 1 tablet by mouth  daily.   ibuprofen (ADVIL,MOTRIN) 200 MG tablet Take 200 mg by mouth every 6 (six) hours as needed.   Multiple Vitamin (MULTIVITAMIN ADULT PO) Take 1 tablet by mouth daily.   naproxen sodium (ANAPROX) 220 MG tablet Take 220 mg by mouth 2 (two) times daily with a meal.   OVER THE COUNTER MEDICATION 1 tablet daily. MSM Joint Formula with Glucosamine   triamcinolone cream (KENALOG) 0.1 %    No facility-administered encounter medications on file as of 09/06/2022.   ALLERGIES: Allergies  Allergen Reactions   Penicillins    Promethazine Hcl     Patient states she had hallucinations using phenergan    VACCINATION STATUS: Immunization History  Administered Date(s) Administered   Td 11/20/1985    HPI Alexa Berger is 61 y.o. female who presents today with a medical history as above. she is being seen in follow-up after she was seen in consultation for multinodular goiter requested by [facility],  Beverely Low, MD.  -Notes from previous visits.  She was found to have multiple nodules on prior ultrasound performed in April 2022.   Patient was also noted to have suppressed TSH previously.  Thyroid uptake and scan 2014 unremarkable.  She was not offered antithyroid intervention at that time.  Send thyroid function test still show suppressed TSH.   She also has significant decrease in her bone density consistent with osteoporosis of the spine. She denies dysphagia, shortness of breath, nor voice change.  She does have family history of thyroid dysfunction in her mother and grandparent. She is not currently on antithyroid intervention or thyroid hormone supplements. However, she is on several supplements including multivitamin, vitamin D3, B complex, calcium/magnesium/zinc, and the same joint formula with glucosamine and chondroitin sulfate.  She does not have other major medical problems.  Review of Systems  Constitutional: + Minimally fluctuating body weight,  no fatigue, no subjective hyperthermia,  no subjective hypothermia Eyes: no blurry vision, no xerophthalmia ENT: no sore throat, no nodules palpated in throat, no dysphagia/odynophagia, no hoarseness Cardiovascular: no Chest Pain, no Shortness of Breath, no palpitations, no leg swelling Respiratory: no cough, no shortness of breath Gastrointestinal: no Nausea/Vomiting/Diarhhea Musculoskeletal: no muscle/joint aches Skin: no rashes Neurological: no tremors, no numbness, no tingling, no dizziness Psychiatric: no depression, no anxiety  Objective:       09/06/2022    3:46 PM 08/30/2022    1:16 PM 08/18/2014    3:57 PM  Vitals with BMI  Height 5' 4.75" 5' 4.75" '5\' 6"'$   Weight 173 lbs 6 oz 168 lbs 3 oz 160 lbs  BMI 29.07 23.55 73.2  Systolic 202 92 542  Diastolic 78 68 73  Pulse 92 84 83    BP 108/78   Pulse 92   Ht 5' 4.75" (1.645 m)   Wt 173 lb 6.4 oz (78.7 kg)   BMI 29.08 kg/m   Wt Readings from Last 3 Encounters:  09/06/22 173 lb 6.4 oz (78.7 kg)  08/30/22 168 lb 3.2 oz (76.3 kg)  08/18/14 160 lb (72.6 kg)    Physical Exam  Constitutional:  Body mass index is 29.08 kg/m.,  not in acute distress, normal state of mind Eyes: PERRLA, EOMI, no exophthalmos ENT: moist mucous membranes, no gross thyromegaly, + gross cervical lymphadenopathy Cardiovascular: normal precordial activity, Regular Rate and Rhythm, no   Endocrine/2022 thyroid ultrasound: Right lobe 4.2 cm with 1 cm nodule, left lobe 5.6 cm with 4.2 cm large dominant nodule. Recent Results (from the past 2160 hour(s))  TSH     Status: Abnormal   Collection Time: 08/30/22  2:42 PM  Result Value Ref Range   TSH 0.082 (L) 0.450 - 4.500 uIU/mL  T4, free     Status: None   Collection Time: 08/30/22  2:42 PM  Result Value Ref Range   Free T4 1.08 0.82 - 1.77 ng/dL  T3, free     Status: None   Collection Time: 08/30/22  2:42 PM  Result Value Ref Range   T3, Free 3.0 2.0 - 4.4 pg/mL  Thyroid peroxidase antibody     Status: None   Collection Time:  08/30/22  2:42 PM  Result Value Ref Range   Thyroperoxidase Ab SerPl-aCnc 12 0 - 34 IU/mL  Thyroglobulin antibody     Status: None   Collection Time: 08/30/22  2:42 PM  Result Value Ref Range   Thyroglobulin Antibody <1.0 0.0 - 0.9 IU/mL    Comment: Thyroglobulin Antibody measured by [employer]  Coulter Methodology    Bone density from December 29, 2021 showed T score of -3.4 on the spine.  She has T score of -0.1.4 on right femur neck, -1.3 on left femur neck.    Assessment & Plan:   1.  Toxic multinodular goiter   2.  Acute process   - I have reviewed her available and new studies ordered thyroid function and bone density.   Accordingly, she is determined to have toxic multinodular goiter.  She may need definitive treatment after thyroid uptake and scan.   If thyroid uptake and scan confirms benign primary hypothyroidism, she would benefit from radioactive iodine thyroid ablation.  If her scan shows any cold nodule, she will be considered for fine-needle aspiration biopsy of the large 4.2 cm nodule in the left lobe of her thyroid.  I had a chance to review her bone density, detailed above.  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  I ordered bone density. If repeat study confirms osteoporosis, she has choices of oral Fosamax 70 mg p.o. weekly, or Boniva 150 mg p.o. monthly; or 60 mg of Prolia subcutaneously every 6 months.  - I did not initiate any new prescriptions today. - she is advised to maintain close follow up with Derinda Late, MD for primary care needs.   I spent 32 minutes in the care of the patient today including review of labs from Thyroid Function, CMP, and other relevant labs ; imaging/biopsy records (current and previous including abstractions from other facilities); face-to-face time  discussing  her lab results and symptoms, medications doses, her options of short and long term treatment based on the latest standards of care / guidelines;   and documenting the encounter.  Dan Europe  participated in the discussions, expressed understanding, and voiced agreement with the above plans.  All questions were answered to her satisfaction. she is encouraged to contact clinic should she have any questions or concerns prior to her return visit.   Follow up plan: Return in about 3 weeks (around 09/27/2022), or Virtual, for DXA Scan B4 NV, F/U with Thyroid Uptake and Scan.   Glade Lloyd, MD [facility] [address] [place], [place] [address] Phone: [phone]  Fax: [phone]     09/06/2022, 6:58 PM  This note was partially dictated with voice recognition software. Similar sounding words can be transcribed inadequately or may not  be corrected upon review.

## 2022-09-27 ENCOUNTER — Ambulatory Visit: Payer: BC Managed Care – PPO | Admitting: "Endocrinology

## 2022-09-28 ENCOUNTER — Encounter (HOSPITAL_COMMUNITY)
Admission: RE | Admit: 2022-09-28 | Discharge: 2022-09-28 | Disposition: A | Discharge status: Home / Self Care | Payer: BC Managed Care – PPO | Source: Ambulatory Visit | Attending: "Endocrinology | Admitting: "Endocrinology

## 2022-09-28 DIAGNOSIS — E0520 Thyrotoxicosis with toxic multinodular goiter without thyrotoxic crisis or storm: Secondary | ICD-10-CM | POA: Insufficient documentation

## 2022-09-28 MED ORDER — SODIUM IODIDE I-123 7.4 MBQ CAPS
436.0000 | ORAL_CAPSULE | Freq: Once | ORAL | Status: AC
  Administered 2022-09-28: 436 via ORAL

## 2022-09-29 ENCOUNTER — Encounter (HOSPITAL_COMMUNITY)
Admission: RE | Admit: 2022-09-29 | Discharge: 2022-09-29 | Disposition: A | Discharge status: Home / Self Care | Payer: BC Managed Care – PPO | Source: Ambulatory Visit | Attending: "Endocrinology | Admitting: "Endocrinology

## 2022-10-09 ENCOUNTER — Ambulatory Visit (INDEPENDENT_AMBULATORY_CARE_PROVIDER_SITE_OTHER): Payer: BC Managed Care – PPO | Admitting: "Endocrinology

## 2022-10-09 ENCOUNTER — Encounter: Payer: Self-pay | Admitting: "Endocrinology

## 2022-10-09 VITALS — Ht 64.75 in | Wt 167.0 lb

## 2022-10-09 DIAGNOSIS — E0520 Thyrotoxicosis with toxic multinodular goiter without thyrotoxic crisis or storm: Secondary | ICD-10-CM

## 2022-10-09 DIAGNOSIS — M816 Localized osteoporosis [Lequesne]: Secondary | ICD-10-CM | POA: Diagnosis not present

## 2022-10-09 NOTE — Progress Notes (Signed)
10/09/2022, 6:19 PM   Endocrinology follow-up note-telehealth  Subjective:    Patient ID: Alexa Berger, female    DOB: Jul 08, 1961, PCP Hayden Rasmussen, MD   Past Medical History:  Diagnosis Date   Abnormal pap    ASCUS Neg HPV   Bladder infection    Chicken pox    Fibroids    H/O allergic rhinitis    Low iron    Yeast infection    Past Surgical History:  Procedure Laterality Date   MYOMECTOMY     TONSILLECTOMY     WISDOM TOOTH EXTRACTION     Social History   Socioeconomic History   Marital status: Divorced    Spouse name: Not on file   Number of children: Not on file   Years of education: Not on file   Highest education level: Not on file  Occupational History   Not on file  Tobacco Use   Smoking status: Never   Smokeless tobacco: Never  Substance and Sexual Activity   Alcohol use: Yes    Comment: socially    Drug use: No   Sexual activity: Not Currently    Partners: Male    Birth control/protection: I.U.D.  Other Topics Concern   Not on file  Social History Narrative   Not on file   Social Determinants of Health   Financial Resource Strain: Not on file  Food Insecurity: Not on file  Transportation Needs: Not on file  Physical Activity: Not on file  Stress: Not on file  Social Connections: Not on file   Family History  Problem Relation Age of Onset   Osteoporosis Mother    Thyroid disease Mother    Osteoporosis Maternal Grandmother    Thyroid disease Other    Hypertension Other    Migraines Other    Outpatient Encounter Medications as of 10/09/2022  Medication Sig   acetaminophen (TYLENOL) 500 MG tablet Take 500 mg by mouth every 6 (six) hours as needed.   B Complex Vitamins (VITAMIN B COMPLEX PO) Take 1 tablet by mouth daily.   CALCIUM-MAGNESIUM-ZINC PO Take 1 tablet by mouth daily.   cetirizine (ZYRTEC) 10 MG tablet Take 10 mg by mouth.   Cholecalciferol (VITAMIN D3 PO) Take 1  tablet by mouth daily.   ibuprofen (ADVIL,MOTRIN) 200 MG tablet Take 200 mg by mouth every 6 (six) hours as needed.   Multiple Vitamin (MULTIVITAMIN ADULT PO) Take 1 tablet by mouth daily.   naproxen sodium (ANAPROX) 220 MG tablet Take 220 mg by mouth 2 (two) times daily with a meal. (Patient not taking: Reported on 10/09/2022)   OVER THE COUNTER MEDICATION 1 tablet daily. MSM Joint Formula with Glucosamine   [DISCONTINUED] triamcinolone cream (KENALOG) 0.1 %    No facility-administered encounter medications on file as of 10/09/2022.   ALLERGIES: Allergies  Allergen Reactions   Penicillins    Promethazine Hcl     Patient states she had hallucinations using phenergan    VACCINATION STATUS: Immunization History  Administered Date(s) Administered   Td 11/20/1985    HPI Alexa Berger is 61 y.o. female who presents today with a medical history as above. she is being engaged in telehealth after she was seen  in consultation for  toxic multinodular goiter requested by Hayden Rasmussen, MD.  -Notes from previous visits.  She was found to have multiple nodules on prior ultrasound performed in April 2022.   Patient was also noted to have suppressed TSH previously.  Thyroid uptake and scan 2014 unremarkable.  She was not offered antithyroid intervention at that time.   Her previsit thyroid uptake and scan confirms toxic multinodular goiter-with toxic adenoma bilaterally. She also has significant decrease in her bone density consistent with osteoporosis of the spine.  She declined treatment offered, wishes to wait until her next bone density. She denies dysphagia, shortness of breath, nor voice change.  She does have family history of thyroid dysfunction in her mother and grandparent. She is not currently on antithyroid intervention or thyroid hormone supplements. However, she is on several supplements including multivitamin, vitamin D3, B complex, calcium/magnesium/zinc, and the same joint  formula with glucosamine and chondroitin sulfate.  She does not have other major medical problems.  Review of Systems  Constitutional: + Minimally fluctuating body weight,  no fatigue, no subjective hyperthermia, no subjective hypothermia Eyes: no blurry vision, no xerophthalmia ENT: no sore throat, no nodules palpated in throat, no dysphagia/odynophagia, no hoarseness   Objective:       10/09/2022    2:47 PM 09/06/2022    3:46 PM 08/30/2022    1:16 PM  Vitals with BMI  Height 5' 4.75" 5' 4.75" 5' 4.75"  Weight 167 lbs 173 lbs 6 oz 168 lbs 3 oz  BMI 27.99 98.33 82.50  Systolic  539 92  Diastolic  78 68  Pulse  92 84    Ht 5' 4.75" (1.645 m)   Wt 167 lb (75.8 kg)   BMI 28.01 kg/m   Wt Readings from Last 3 Encounters:  10/09/22 167 lb (75.8 kg)  09/06/22 173 lb 6.4 oz (78.7 kg)  08/30/22 168 lb 3.2 oz (76.3 kg)    Physical Exam  Constitutional:  Body mass index is 28.01 kg/m.,  not in acute distress, normal state of mind Eyes: PERRLA, EOMI, no exophthalmos ENT: moist mucous membranes, no gross thyromegaly, + gross cervical lymphadenopathy Cardiovascular: normal precordial activity, Regular Rate and Rhythm, no   Endocrine/2022 thyroid ultrasound: Right lobe 4.2 cm with 1 cm nodule, left lobe 5.6 cm with 4.2 cm large dominant nodule. Recent Results (from the past 2160 hour(s))  TSH     Status: Abnormal   Collection Time: 08/30/22  2:42 PM  Result Value Ref Range   TSH 0.082 (L) 0.450 - 4.500 uIU/mL  T4, free     Status: None   Collection Time: 08/30/22  2:42 PM  Result Value Ref Range   Free T4 1.08 0.82 - 1.77 ng/dL  T3, free     Status: None   Collection Time: 08/30/22  2:42 PM  Result Value Ref Range   T3, Free 3.0 2.0 - 4.4 pg/mL  Thyroid peroxidase antibody     Status: None   Collection Time: 08/30/22  2:42 PM  Result Value Ref Range   Thyroperoxidase Ab SerPl-aCnc 12 0 - 34 IU/mL  Thyroglobulin antibody     Status: None   Collection Time: 08/30/22   2:42 PM  Result Value Ref Range   Thyroglobulin Antibody <1.0 0.0 - 0.9 IU/mL    Comment: Thyroglobulin Antibody measured by [employer] Methodology    Bone density from December 29, 2021 showed T score of -3.4 on the spine.  She has T score of -  0.1.4 on right femur neck, -1.3 on left femur neck.   Thyroid uptake and scan on October 03, 2022: FINDINGS: Hot nodule at mid inferior pole of LEFT thyroid lobe, corresponding to the 4.2 cm diameter mass seen on ultrasound.   Subtle warm nodule at upper pole RIGHT thyroid lobe corresponding to the 1.0 cm nodule on ultrasound.   Suppression of uptake in remaining thyroid tissue.   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   Tiny hyperfunctional adenoma at superior pole RIGHT thyroid lobe.   Assessment & Plan:   1.  Toxic multinodular goiter   2.   Osteoporosis  - I have reviewed her available and new studies ordered thyroid function tests as well as thyroid uptake and scan .   Accordingly, she is determined to have toxic multinodular goiter, now confirmed by thyroid uptake and scan.  She  would need definitive treatment . I discussed her options including radioactive iodine treatment and antithyroid medications with methimazole.  She did not accept any of these options today, she will think about these options and call back.   Feeling warm nodule on the largest nodule described in the previous ultrasound, she will not need biopsy at this time.  I discussed the risk of uncontrolled thyrotoxicosis including thyroid storm with her. I had a chance to review her bone density, detailed above.  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  Her insurance did not provide coverage for a steady 2 years. If she decides to get treated,  she has choices of oral Fosamax 70 mg p.o. weekly, or Boniva 150 mg p.o. monthly; or 60 mg of Prolia subcutaneously every 6 months.  - I did not initiate any new prescriptions today. - she is advised to maintain close follow up with Hayden Rasmussen, MD for primary care needs.   I spent 22 minutes in the care of the patient today including review of labs from Thyroid Function, thyroid uptake and scan, CMP, and other relevant labs ; imaging/biopsy records (current and previous including abstractions from other facilities); on the phone with the patient discussing  her lab results and symptoms, medications doses, her options of short and long term treatment based on the latest standards of care / guidelines;   and documenting the encounter.  Dan Europe  participated in the discussions, expressed understanding, and voiced agreement with the above plans.  All questions were answered to her satisfaction. she is encouraged to contact clinic should she have any questions or concerns prior to her return visit.    Follow up plan: Return Pt said she will think about her options and will call back.if she returns,I want her to repeat labs.   Glade Lloyd, MD [facility] [address] [place], [place] [address] Phone: [phone]  Fax: [phone]     10/09/2022, 6:19 PM  This note was partially dictated with voice recognition software. Similar sounding words can be transcribed inadequately or may not  be corrected upon review.

## 2023-08-13 ENCOUNTER — Ambulatory Visit: Payer: BC Managed Care – PPO | Admitting: Internal Medicine

## 2023-08-13 ENCOUNTER — Telehealth: Payer: Self-pay | Admitting: Internal Medicine

## 2023-08-13 ENCOUNTER — Encounter: Payer: Self-pay | Admitting: Internal Medicine

## 2023-08-13 VITALS — BP 112/70 | HR 67 | Ht 64.75 in | Wt 171.0 lb

## 2023-08-13 DIAGNOSIS — E042 Nontoxic multinodular goiter: Secondary | ICD-10-CM

## 2023-08-13 DIAGNOSIS — M816 Localized osteoporosis [Lequesne]: Secondary | ICD-10-CM | POA: Diagnosis not present

## 2023-08-13 DIAGNOSIS — E0520 Thyrotoxicosis with toxic multinodular goiter without thyrotoxic crisis or storm: Secondary | ICD-10-CM

## 2023-08-13 LAB — COMPREHENSIVE METABOLIC PANEL
ALT: 17 U/L (ref 0–35)
AST: 21 U/L (ref 0–37)
Albumin: 4.3 g/dL (ref 3.5–5.2)
Alkaline Phosphatase: 85 U/L (ref 39–117)
BUN: 11 mg/dL (ref 6–23)
CO2: 29 mEq/L (ref 19–32)
Calcium: 10 mg/dL (ref 8.4–10.5)
Chloride: 104 mEq/L (ref 96–112)
Creatinine, Ser: 0.68 mg/dL (ref 0.40–1.20)
GFR: 94.79 mL/min (ref >60.00)
Glucose, Bld: 85 mg/dL (ref 70–99)
Potassium: 4.5 mEq/L (ref 3.5–5.1)
Sodium: 140 mEq/L (ref 135–145)
Total Bilirubin: 1.1 mg/dL (ref 0.2–1.2)
Total Protein: 7 g/dL (ref 6.0–8.3)

## 2023-08-13 LAB — CBC WITH DIFFERENTIAL/PLATELET
Basophils Absolute: 0 10*3/uL (ref 0.0–0.1)
Basophils Relative: 0.7 % (ref 0.0–3.0)
Eosinophils Absolute: 0.1 10*3/uL (ref 0.0–0.7)
Eosinophils Relative: 2 % (ref 0.0–5.0)
HCT: 40.9 % (ref 36.0–46.0)
Hemoglobin: 13.4 g/dL (ref 12.0–15.0)
Lymphocytes Relative: 30.8 % (ref 12.0–46.0)
Lymphs Abs: 1.9 10*3/uL (ref 0.7–4.0)
MCHC: 32.7 g/dL (ref 30.0–36.0)
MCV: 91.9 fl (ref 78.0–100.0)
Monocytes Absolute: 0.4 10*3/uL (ref 0.1–1.0)
Monocytes Relative: 7.3 % (ref 3.0–12.0)
Neutro Abs: 3.6 10*3/uL (ref 1.4–7.7)
Neutrophils Relative %: 59.2 % (ref 43.0–77.0)
Platelets: 356 10*3/uL (ref 150.0–400.0)
RBC: 4.46 Mil/uL (ref 3.87–5.11)
RDW: 12.9 % (ref 11.5–15.5)
WBC: 6.1 10*3/uL (ref 4.0–10.5)

## 2023-08-13 LAB — T4, FREE: Free T4: 0.7 ng/dL (ref 0.60–1.60)

## 2023-08-13 LAB — VITAMIN D 25 HYDROXY (VIT D DEFICIENCY, FRACTURES): VITD: 52.2 ng/mL (ref 30.00–100.00)

## 2023-08-13 LAB — TSH: TSH: 6.69 u[IU]/mL — ABNORMAL HIGH (ref 0.35–5.50)

## 2023-08-13 LAB — PHOSPHORUS: Phosphorus: 3.3 mg/dL (ref 2.3–4.6)

## 2023-08-13 LAB — T3, FREE: T3, Free: 3.6 pg/mL (ref 2.3–4.2)

## 2023-08-13 NOTE — Telephone Encounter (Signed)
Please schedule the patient for bone density at [facility] mammography    She prefers Mondays or Fridays   Thanks

## 2023-08-13 NOTE — Progress Notes (Unsigned)
Name: Alexa Berger  MRN/ DOB: [id number], Sep 27, 1961    Age/ Sex: 62 y.o., female     PCP: Dois Davenport, MD   Reason for Endocrinology Evaluation: Osteoporosis     Initial Endocrinology Clinic Visit: 08/13/2023    PATIENT IDENTIFIER: Alexa Berger is a 62 y.o., female with a past medical history of osteoporosis and multinodular goiter. She has followed with [facility] since 08/13/2023 for consultative assistance with management of her osteoporosis.   HISTORICAL SUMMARY:  The patient has been diagnosed with a toxic left inferior hyperfunctioning adenoma based on thyroid uptake and scan from 2014.  She did not receive any treatment for this  She had thyroid ultrasound 02/2021 revealing multiple nodular goiter, with a left inferior 4.2 cm nodule, meeting FNA criteria   Repeat thyroid uptake scan confirmed left inferior hyperfunctioning nodule 09/2022   In reviewing her records she had a low TSH of 0.082u IU/mL 08/2022 with normal free T4 and T3.  She has normal TPO antibodies at 12 in 08/2022 (reference <34)  Of note, the patient was evaluated by Dr. Fransico Him 08/2022   Was started on Methimazole in 2021 through Dr . Hal Hope , she went off for a couple of years and restarted again 2024  Mother and maternal GM with hypothyroidism   OSTEOPOROSIS HISTORY: Pt was diagnosed with osteoporosis: 12/2021 with a T-score of -3.4 at the [facility]  Menarche at age : [age] Menopausal at age : was on IUD in 1998 until menopause  Fracture Hx: no Hx of HRT: n/a FH of osteoporosis or hip fracture: Maternal Grandmother  Prior Hx of anti-resorptive therapy : N/A   She was also seen by Dr. Talmage Nap who recommended thyroid sx , [facility] subsequently [facility]     She was evaluated by Dr. Fransico Him 09/2022 she was offered bisphosphonate therapy versus Prolia, patient opted not to receive any treatment and to wait until next DXA scan   SUBJECTIVE:     Today (08/13/2023):  [name] is here for a follow-up on multinodular goiter, subclinical hyperthyroid, and osteoporosis   Weight fluctuates  Has arthritis of joint and back  Denies local neck swelling  Denies palpitations  Denies tremors  No change in bowel movement  Denies anxiety  Has been doing PT , limited weight bearing exercise   Recently retired from school system  MVI daily  Calcium not recently  Vitamin D 5000 internation units daily  Methimazole 5 mg TID     HISTORY:  Past Medical History:  Past Medical History:  Diagnosis Date   Abnormal pap    ASCUS Neg HPV   Bladder infection    Chicken pox    Fibroids    H/O allergic rhinitis    Low iron    Yeast infection    Past Surgical History:  Past Surgical History:  Procedure Laterality Date   MYOMECTOMY     TONSILLECTOMY     WISDOM TOOTH EXTRACTION     Social History:  reports that she has never smoked. She has never used smokeless tobacco. She reports current alcohol use. She reports that she does not use drugs. Family History:  Family History  Problem Relation Age of Onset   Osteoporosis Mother    Thyroid disease Mother    Osteoporosis Maternal Grandmother    Thyroid disease Other    Hypertension Other    Migraines Other      HOME MEDICATIONS: Allergies as  of 08/13/2023       Reactions   Other Rash, Cough   Oseltamivir Other (See Comments)   Penicillins    Promethazine Hcl    Patient states she had hallucinations using phenergan        Medication List        Accurate as of August 13, 2023 11:22 AM. If you have any questions, ask your nurse or doctor.          acetaminophen 500 MG tablet Commonly known as: TYLENOL Take 500 mg by mouth every 6 (six) hours as needed.   CALCIUM-MAGNESIUM-ZINC PO Take 1 tablet by mouth daily.   cetirizine 10 MG tablet Commonly known as: ZYRTEC Take 10 mg by mouth.   ibuprofen 200 MG tablet Commonly known as: ADVIL Take 200 mg by  mouth every 6 (six) hours as needed.   influenza vaccine 0.5 ML injection Commonly known as: FLUCELVAX - egg free TO BE ADMINISTERED BY PHARMACIST FOR IMMUNIZATION   meloxicam 7.5 MG tablet Commonly known as: MOBIC Take 7.5 mg by mouth daily.   methimazole 5 MG tablet Commonly known as: TAPAZOLE Take 5 mg by mouth daily.   methocarbamol 500 MG tablet Commonly known as: ROBAXIN Take 500 mg by mouth 3 (three) times daily.   MULTIVITAMIN ADULT PO Take 1 tablet by mouth daily.   naproxen sodium 220 MG tablet Commonly known as: ALEVE Take 220 mg by mouth 2 (two) times daily with a meal.   OVER THE COUNTER MEDICATION 1 tablet daily. MSM Joint Formula with Glucosamine   VITAMIN B COMPLEX PO Take 1 tablet by mouth daily.   VITAMIN D3 PO Take 1 tablet by mouth daily.          OBJECTIVE:   PHYSICAL EXAM: VS: BP 112/70 (BP Location: Left Arm, Patient Position: Sitting, Cuff Size: Small)   Pulse 67   Ht 5' 4.75" (1.645 m)   Wt 171 lb (77.6 kg)   SpO2 99%   BMI 28.68 kg/m    EXAM: General: Pt appears well and is in NAD  Eyes: External eye exam normal without stare, lid lag or exophthalmos.  EOM intact.    Neck: General: Supple without adenopathy. Thyroid: Thyroid size normal.  No goiter or nodules appreciated. No thyroid bruit.  Lungs: Clear with good BS bilat   Heart: Auscultation: RRR.  Abdomen: soft, nontender  Extremities:  BL LE: No pretibial edema  Mental Status: Judgment, insight: Intact Orientation: Oriented to time, place, and person Mood and affect: No depression, anxiety, or agitation     DATA REVIEWED: ***  Thyroid Ultrasound 02/18/2021  Estimated total number of nodules >/= 1 cm: 2   Number of spongiform nodules >/=  2 cm not described below (TR1): 0   Number of mixed cystic and solid nodules >/= 1.5 cm not described below (TR2): 0   _________________________________________________________   Nodule # 1:   Location: Right; Superior    Maximum size: 1.0 cm; Other 2 dimensions: 0.4 x 0.6 cm   Composition: solid/almost completely solid (2)   Echogenicity: hypoechoic (2)   Shape: not taller-than-wide (0)   Margins: smooth (0)   Echogenic foci: none (0)   ACR TI-RADS total points: 4.   ACR TI-RADS risk category: TR4 (4-6 points).   ACR TI-RADS recommendations:   *Given size (>/= 1 - 1.4 cm) and appearance, a follow-up ultrasound in 1 year should be considered based on TI-RADS criteria.   _________________________________________________________   Nodule # 2:  Location: Left; Inferior   Maximum size: 4.2 cm; Other 2 dimensions: 2.2 x 1.8 cm   Composition: solid/almost completely solid (2). Some scattered internal cystic areas.   Echogenicity: isoechoic (1)   Shape: not taller-than-wide (0)   Margins: ill-defined (0)   Echogenic foci: none (0)   ACR TI-RADS total points: 3.   ACR TI-RADS risk category: TR3 (3 points).   ACR TI-RADS recommendations:   **Given size (>/= 2.5 cm) and appearance, fine needle aspiration of this mildly suspicious nodule should be considered based on TI-RADS criteria. However, this corresponds well in size and location to the previous nuclear medicine hyperfunctioning nodule and the fact that this area was previously hyperfunctioning reduces the chance of malignancy. Follow-up ultrasound may be reasonable given that there is no prior ultrasound imaging of the thyroid for comparison.   _________________________________________________________   No abnormal lymph nodes identified.   IMPRESSION: 1. 4.2 cm left inferior thyroid nodule technically meets criteria for fine-needle aspiration based on current ultrasound appearance. However, this corresponds in size and location to the previous hyperfunctioning nodule seen by nuclear medicine back in 2014. This reduces the risk of malignancy. Follow-up ultrasound would also be reasonable. Referral to Endocrinology for  management might be helpful. 2. 1 cm right superior thyroid nodule meets criteria for 1 year follow-up ultrasound.    THYROID UPTAKE AND SCAN 10/03/2022  FINDINGS: Hot nodule at mid inferior pole of LEFT thyroid lobe, corresponding to the 4.2 cm diameter mass seen on ultrasound.   Subtle warm nodule at upper pole RIGHT thyroid lobe corresponding to the 1.0 cm nodule on ultrasound.   Suppression of uptake in remaining thyroid tissue.   4 hour I-123 uptake = 6.7% (normal 5-20%)   24 hour I-123 uptake = 21.4% (normal 10-30%)   IMPRESSION: Large hyperfunctional adenoma at mid inferior pole of LEFT thyroid lobe.   Tiny hyperfunctional adenoma at superior pole RIGHT thyroid lobe.          ASSESSMENT / PLAN / RECOMMENDATIONS:   Multinodular goiter     Medications   2.  Hyperthyroidism:     3. Osteoporosis :   Signed electronically by: Lyndle Herrlich, MD  [facility]  [facility] [address], [address] [place], [place] [address] Phone: [phone] FAX: [phone]      CC: Dois Davenport, MD [address] [place] [address] [place] [address] Phone: [phone]  Fax: [phone]   Return to Endocrinology clinic as below: No future appointments.

## 2023-08-13 NOTE — Telephone Encounter (Signed)
Patient was scheduled too soon and will contact [facility] to reschedule appointment

## 2023-08-14 ENCOUNTER — Telehealth: Payer: Self-pay | Admitting: Internal Medicine

## 2023-08-14 LAB — PARATHYROID HORMONE, INTACT (NO CA): PTH: 59 pg/mL (ref 16–77)

## 2023-08-14 MED ORDER — METHIMAZOLE 5 MG PO TABS: 5.0000 mg | ORAL_TABLET | Freq: Two times a day (BID) | ORAL | 3 refills | Status: DC

## 2023-08-14 NOTE — Telephone Encounter (Signed)
Please let the patient know that her thyroid test shows that she is on too much methimazole, patient needs to decrease methimazole from 3 tablets a day to TWO tablets daily from now on    Please let her know that the rest of her blood work came back normal including hemoglobin level, white cell count ,liver enzymes, kidney function, parathyroid hormone and phosphorus levels     Thanks

## 2023-08-14 NOTE — Telephone Encounter (Signed)
Patient would like to know how much Vitamin D she needs to be on.

## 2023-08-15 NOTE — Telephone Encounter (Signed)
Patient aware and verbalized understanding. °

## 2023-09-21 ENCOUNTER — Ambulatory Visit
Admission: RE | Admit: 2023-09-21 | Discharge: 2023-09-21 | Disposition: A | Discharge status: Home / Self Care | Payer: BC Managed Care – PPO | Source: Ambulatory Visit | Attending: Internal Medicine | Admitting: Internal Medicine

## 2023-09-21 DIAGNOSIS — E042 Nontoxic multinodular goiter: Secondary | ICD-10-CM

## 2023-12-31 LAB — HM DEXA SCAN

## 2024-01-03 ENCOUNTER — Encounter: Payer: Self-pay | Admitting: Internal Medicine

## 2024-01-03 ENCOUNTER — Ambulatory Visit: Payer: BC Managed Care – PPO | Admitting: Internal Medicine

## 2024-01-03 ENCOUNTER — Telehealth: Payer: Self-pay | Admitting: Internal Medicine

## 2024-01-03 VITALS — BP 120/72 | HR 90 | Ht 64.75 in | Wt 178.0 lb

## 2024-01-03 DIAGNOSIS — M816 Localized osteoporosis [Lequesne]: Secondary | ICD-10-CM

## 2024-01-03 DIAGNOSIS — E042 Nontoxic multinodular goiter: Secondary | ICD-10-CM

## 2024-01-03 DIAGNOSIS — E0590 Thyrotoxicosis, unspecified without thyrotoxic crisis or storm: Secondary | ICD-10-CM | POA: Diagnosis not present

## 2024-01-03 LAB — T4, FREE: Free T4: 1.2 ng/dL (ref 0.8–1.8)

## 2024-01-03 LAB — TSH: TSH: 2.98 m[IU]/L (ref 0.40–4.50)

## 2024-01-03 LAB — T3, FREE: T3, Free: 4.1 pg/mL (ref 2.3–4.2)

## 2024-01-03 NOTE — Progress Notes (Unsigned)
Name: Alexa Berger  MRN/ DOB: [id number], [birth year]    Age/ Sex: 63 y.o., female     PCP: Dois Davenport, MD   Reason for Endocrinology Evaluation: Osteoporosis     Initial Endocrinology Clinic Visit: 08/13/2023    PATIENT IDENTIFIER: Ms. Alexa Berger is a 63 y.o., female with a past medical history of osteoporosis and multinodular goiter. She has followed with [facility] since 08/13/2023 for consultative assistance with management of her osteoporosis.   HISTORICAL SUMMARY:  The patient has been diagnosed with a toxic left inferior hyperfunctioning adenoma based on thyroid uptake and scan from 2014.  She did not receive any treatment for this  She had thyroid ultrasound 02/2021 revealing multiple nodular goiter, with a left inferior 4.2 cm nodule, meeting FNA criteria   Repeat thyroid uptake scan confirmed left inferior hyperfunctioning nodule 09/2022   In reviewing her records she had a low TSH of 0.082u IU/mL 08/2022 with normal free T4 and T3.  She has normal TPO antibodies at 12 in 08/2022 (reference <34)  Of note, the patient was evaluated by Dr. Fransico Him 08/2022   Was started on Methimazole in 2021 through Dr . Hal Hope , she went off for a couple of years and restarted again 2024  Mother and maternal GM with hypothyroidism   OSTEOPOROSIS HISTORY: Pt was diagnosed with osteoporosis: 12/2021 with a T-score of -3.4 at [facility]  Menarche at age : [age] Menopausal at age : was on IUD in 1998 until menopause  Fracture Hx: no Hx of HRT: n/a FH of osteoporosis or hip fracture: Maternal Grandmother  Prior Hx of anti-resorptive therapy : N/A   She was also seen by Dr. Talmage Nap who recommended thyroid sx , she was subsequently [facility]      She was evaluated by Dr. Fransico Him 09/2022 she was offered bisphosphonate therapy versus Prolia, patient opted not to receive any treatment and to wait until next DXA scan   SUBJECTIVE:     Today (01/03/2024):  Alexa Berger is here for a follow-up on multinodular goiter, subclinical hyperthyroid, and osteoporosis   Denies local neck swelling  Denies palpitations  Denies tremors  She denies constipation or diarrhea   MVI daily  Calcium not recently  Vitamin D 5000 internation units daily  Methimazole 5 mg BID    HISTORY:  Past Medical History:  Past Medical History:  Diagnosis Date   Abnormal pap    ASCUS Neg HPV   Bladder infection    Chicken pox    Fibroids    H/O allergic rhinitis    Low iron    Yeast infection    Past Surgical History:  Past Surgical History:  Procedure Laterality Date   MYOMECTOMY     TONSILLECTOMY     WISDOM TOOTH EXTRACTION     Social History:  reports that she has never smoked. She has never used smokeless tobacco. She reports current alcohol use. She reports that she does not use drugs. Family History:  Family History  Problem Relation Age of Onset   Osteoporosis Mother    Thyroid disease Mother    Osteoporosis Maternal Grandmother    Thyroid disease Other    Hypertension Other    Migraines Other      HOME MEDICATIONS: Allergies as of 01/03/2024       Reactions   Other Rash, Cough   Oseltamivir Other (See Comments)   Penicillins    Promethazine Hcl  Patient states she had hallucinations using phenergan        Medication List        Accurate as of January 03, 2024  9:13 AM. If you have any questions, ask your nurse or doctor.          acetaminophen 500 MG tablet Commonly known as: TYLENOL Take 500 mg by mouth every 6 (six) hours as needed.   CALCIUM-MAGNESIUM-ZINC PO Take 1 tablet by mouth daily.   cetirizine 10 MG tablet Commonly known as: ZYRTEC Take 10 mg by mouth.   fluticasone 50 MCG/ACT nasal spray Commonly known as: FLONASE Place 1 spray into both nostrils 2 (two) times daily.   ibuprofen 200 MG tablet Commonly known as: ADVIL Take 200 mg by mouth every 6 (six) hours as needed.    influenza vaccine 0.5 ML injection Commonly known as: FLUCELVAX - egg free TO BE ADMINISTERED BY PHARMACIST FOR IMMUNIZATION   meloxicam 7.5 MG tablet Commonly known as: MOBIC Take 7.5 mg by mouth daily.   methimazole 5 MG tablet Commonly known as: TAPAZOLE Take 1 tablet (5 mg total) by mouth 2 (two) times daily.   methocarbamol 500 MG tablet Commonly known as: ROBAXIN Take 500 mg by mouth 3 (three) times daily.   MULTIVITAMIN ADULT PO Take 1 tablet by mouth daily.   naproxen sodium 220 MG tablet Commonly known as: ALEVE Take 220 mg by mouth 2 (two) times daily with a meal.   OVER THE COUNTER MEDICATION 1 tablet daily. MSM Joint Formula with Glucosamine   VITAMIN B COMPLEX PO Take 1 tablet by mouth daily.   VITAMIN D3 PO Take 1 tablet by mouth daily.          OBJECTIVE:   PHYSICAL EXAM: VS: BP 120/72 (BP Location: Left Arm, Patient Position: Sitting, Cuff Size: Normal)   Pulse 90   Ht 5' 4.75" (1.645 m)   Wt 178 lb (80.7 kg)   SpO2 98%   BMI 29.85 kg/m    EXAM: General: Pt appears well and is in NAD  Neck: General: Supple without adenopathy. Thyroid: Bilateral thyroid asymmetry noted  Lungs: Clear with good BS bilat   Heart: Auscultation: RRR.  Mental Status: Judgment, insight: Intact Orientation: Oriented to time, place, and person Mood and affect: No depression, anxiety, or agitation     DATA REVIEWED:       Latest Reference Range & Units 01/03/24 10:28  TSH 0.40 - 4.50 mIU/L 2.98  Triiodothyronine,Free,Serum 2.3 - 4.2 pg/mL 4.1  T4,Free(Direct) 0.8 - 1.8 ng/dL 1.2       Thyroid Ultrasound 10/03/2023  Estimated total number of nodules >/= 1 cm: 2   Number of spongiform nodules >/=  2 cm not described below (TR1): 0   Number of mixed cystic and solid nodules >/= 1.5 cm not described below (TR2): 0   _________________________________________________________   Nodule labeled 1 in the right mid thyroid, 1.1 cm,  essentially unchanged in size from the comparison. The echotexture is isoechoic on today's ultrasound, which technically classifies this as TR 3 risk and decreases the overall risk. Given the previous ultrasound, continued surveillance would be reasonable.   Nodule labeled 2 in the mid left thyroid, 4.5 cm, unchanged. Again this corresponded to a hyperfunctioning adenoma/nodule on a prior nuclear medicine study of 2014, as well as 2023. technically this meets criteria for biopsy.   No adenopathy   IMPRESSION: Similar appearance of thyroid as well as bilateral thyroid nodules as above.   Nodule  at the inferior left thyroid (labeled 2) technically meets criteria for biopsy, though corresponds to hyperfunctioning nodule/adenoma on serial prior nuclear medicine studies. Either surveillance or referral for biopsy may be appropriate.   Right mid thyroid nodule (labeled 1) meets criteria for surveillance, as designated by the newly established ACR TI-RADS criteria. Surveillance ultrasound study recommended to be performed annually up to 5 years.     THYROID UPTAKE AND SCAN 10/03/2022  FINDINGS: Hot nodule at mid inferior pole of LEFT thyroid lobe, corresponding to the 4.2 cm diameter mass seen on ultrasound.   Subtle warm nodule at upper pole RIGHT thyroid lobe corresponding to the 1.0 cm nodule on ultrasound.   Suppression of uptake in remaining thyroid tissue.   4 hour I-123 uptake = 6.7% (normal 5-20%)   24 hour I-123 uptake = 21.4% (normal 10-30%)   IMPRESSION: Large hyperfunctional adenoma at mid inferior pole of LEFT thyroid lobe.   Tiny hyperfunctional adenoma at superior pole RIGHT thyroid lobe.     DXA 12/26/2023 @ [facility]      Change 2023  AP spine  -3.3 Up 1%  RFN -2.1   RTH -1.6 Down 13%  LFN -1.5   LTH -1.2 Down 3%      ASSESSMENT / PLAN / RECOMMENDATIONS:   Multinodular goiter  -Patient is clinically euthyroid -No local neck symptoms -Left  inferior nodule is " hot" on thyroid uptake and scan, which is associated with low risk of malignancy    2.  Hyperthyroidism:  -This is due to toxic thyroid nodule -Tolerating methimazole without side effects -She has declined RAI and surgical intervention in the past -I did entertain the idea of radiofrequency ablation in the past  -TFT's normal, no change   Medication Continue  methimazole 5 mg twice daily   3. Osteoporosis :  -She had declined treatment in the past, but today she had questions regarding treatment options, we did discuss bisphosphonate therapy, Prolia and to a limited extent Evenity -My first options for her is alendronate versus Prolia, we did discuss the mechanism of action as well as side effects to each class -Patient continues to be indecisive at this point, I did encourage the patient to take time to think about this, I have guided her in thinking about the worst case scenario in both cases and seeing if this would help her make a decision, for example dealing with a fracture versus side effect of the medication -Emphasized importance of calcium and vitamin D intake -Emphasized importance of weightbearing exercises  Follow-up in 6 months   I spent 41 minutes preparing to see the patient by review of recent labs, imaging and procedures, obtaining and reviewing separately obtained history, communicating with the patient, ordering medications, tests or procedures, and documenting clinical information in the EHR including the differential Dx, treatment, and any further evaluation and other management    Signed electronically by: Lyndle Herrlich, MD  [facility]  [facility] [address] [place] [place], [place] [address] Phone: [phone] FAX: [phone]      CC: Dois Davenport, MD [address] [place] [address] [place] [address] Phone: [phone]  Fax: [phone]   Return to Endocrinology clinic as  below: No future appointments.

## 2024-01-03 NOTE — Telephone Encounter (Signed)
It has been sent to HIM to abstracted correctly

## 2024-01-03 NOTE — Telephone Encounter (Signed)
Can you please abstract her bone density in the chart?   Thanks

## 2024-01-04 ENCOUNTER — Encounter: Payer: Self-pay | Admitting: Internal Medicine

## 2024-01-04 MED ORDER — METHIMAZOLE 5 MG PO TABS: 5.0000 mg | ORAL_TABLET | Freq: Two times a day (BID) | ORAL | 3 refills | Status: AC

## 2024-02-11 ENCOUNTER — Ambulatory Visit: Payer: BC Managed Care – PPO | Admitting: Internal Medicine

## 2024-07-01 ENCOUNTER — Ambulatory Visit: Payer: 59 | Admitting: Internal Medicine

## 2024-09-10 ENCOUNTER — Other Ambulatory Visit: Payer: Self-pay | Admitting: Endocrinology

## 2024-09-10 DIAGNOSIS — E042 Nontoxic multinodular goiter: Secondary | ICD-10-CM

## 2024-09-25 ENCOUNTER — Ambulatory Visit
Admission: RE | Admit: 2024-09-25 | Discharge: 2024-09-25 | Disposition: A | Discharge status: Home / Self Care | Source: Ambulatory Visit | Attending: Endocrinology | Admitting: Endocrinology

## 2024-09-25 DIAGNOSIS — E042 Nontoxic multinodular goiter: Secondary | ICD-10-CM
